# Patient Record
Sex: Male | Born: 1955 | Race: White | Hispanic: No | Marital: Single | Smoking: Never smoker
Health system: Southern US, Community
[De-identification: ages and names within clinical notes are randomized; demographics above are authoritative.]

## PROBLEM LIST (undated history)

## (undated) DIAGNOSIS — N400 Enlarged prostate without lower urinary tract symptoms: Secondary | ICD-10-CM

## (undated) DIAGNOSIS — I2699 Other pulmonary embolism without acute cor pulmonale: Secondary | ICD-10-CM

## (undated) HISTORY — PX: TONSILLECTOMY: SUR1361

## (undated) HISTORY — PX: VASECTOMY: SHX75

## (undated) HISTORY — PX: JOINT REPLACEMENT: SHX530

---

## 2017-11-24 ENCOUNTER — Other Ambulatory Visit: Payer: Self-pay

## 2017-11-24 ENCOUNTER — Encounter (HOSPITAL_COMMUNITY): Payer: Self-pay | Admitting: Emergency Medicine

## 2017-11-24 ENCOUNTER — Emergency Department (HOSPITAL_COMMUNITY)
Admission: EM | Admit: 2017-11-24 | Discharge: 2017-11-25 | Disposition: A | Discharge status: Home / Self Care | Payer: BC Managed Care – PPO | Attending: Emergency Medicine | Admitting: Emergency Medicine

## 2017-11-24 ENCOUNTER — Emergency Department (HOSPITAL_COMMUNITY): Payer: BC Managed Care – PPO

## 2017-11-24 DIAGNOSIS — R791 Abnormal coagulation profile: Secondary | ICD-10-CM | POA: Diagnosis not present

## 2017-11-24 DIAGNOSIS — M7981 Nontraumatic hematoma of soft tissue: Secondary | ICD-10-CM | POA: Diagnosis not present

## 2017-11-24 DIAGNOSIS — T148XXA Other injury of unspecified body region, initial encounter: Secondary | ICD-10-CM

## 2017-11-24 DIAGNOSIS — M545 Low back pain: Secondary | ICD-10-CM | POA: Diagnosis present

## 2017-11-24 DIAGNOSIS — Z7901 Long term (current) use of anticoagulants: Secondary | ICD-10-CM | POA: Diagnosis not present

## 2017-11-24 HISTORY — DX: Other pulmonary embolism without acute cor pulmonale: I26.99

## 2017-11-24 LAB — CBC WITH DIFFERENTIAL/PLATELET
Basophils Absolute: 0 10*3/uL (ref 0.0–0.1)
Basophils Relative: 0 %
EOS ABS: 0 10*3/uL (ref 0.0–0.7)
EOS PCT: 0 %
HCT: 35.9 % — ABNORMAL LOW (ref 39.0–52.0)
HEMOGLOBIN: 12.2 g/dL — AB (ref 13.0–17.0)
LYMPHS ABS: 0.7 10*3/uL (ref 0.7–4.0)
LYMPHS PCT: 8 %
MCH: 30.3 pg (ref 26.0–34.0)
MCHC: 34 g/dL (ref 30.0–36.0)
MCV: 89.1 fL (ref 78.0–100.0)
Monocytes Absolute: 0.7 10*3/uL (ref 0.1–1.0)
Monocytes Relative: 8 %
Neutro Abs: 7.1 10*3/uL (ref 1.7–7.7)
Neutrophils Relative %: 84 %
Platelets: 276 10*3/uL (ref 150–400)
RBC: 4.03 MIL/uL — AB (ref 4.22–5.81)
RDW: 12.9 % (ref 11.5–15.5)
WBC: 8.5 10*3/uL (ref 4.0–10.5)

## 2017-11-24 LAB — BASIC METABOLIC PANEL
Anion gap: 13 (ref 5–15)
BUN: 20 mg/dL (ref 8–23)
CHLORIDE: 103 mmol/L (ref 98–111)
CO2: 25 mmol/L (ref 22–32)
CREATININE: 0.93 mg/dL (ref 0.61–1.24)
Calcium: 9 mg/dL (ref 8.9–10.3)
GFR calc Af Amer: >60 mL/min (ref >60)
GFR calc non Af Amer: >60 mL/min (ref >60)
GLUCOSE: 127 mg/dL — AB (ref 70–99)
POTASSIUM: 3.9 mmol/L (ref 3.5–5.1)
Sodium: 141 mmol/L (ref 135–145)

## 2017-11-24 LAB — PROTIME-INR
INR: >10
Prothrombin Time: 89.9 seconds — ABNORMAL HIGH (ref 11.4–15.2)

## 2017-11-24 MED ORDER — ONDANSETRON 4 MG PO TBDP
4.0000 mg | ORAL_TABLET | Freq: Once | ORAL | Status: AC
  Administered 2017-11-24: 4 mg via ORAL
  Filled 2017-11-24: qty 1

## 2017-11-24 MED ORDER — KETOROLAC TROMETHAMINE 60 MG/2ML IM SOLN
60.0000 mg | Freq: Once | INTRAMUSCULAR | Status: AC
  Administered 2017-11-24: 60 mg via INTRAMUSCULAR
  Filled 2017-11-24: qty 2

## 2017-11-24 MED ORDER — CYCLOBENZAPRINE HCL 10 MG PO TABS: 5.0000 mg | ORAL_TABLET | Freq: Two times a day (BID) | ORAL | 0 refills | Status: DC | PRN

## 2017-11-24 MED ORDER — HYDROMORPHONE HCL 2 MG/ML IJ SOLN
2.0000 mg | Freq: Once | INTRAMUSCULAR | Status: AC
  Administered 2017-11-24: 2 mg via INTRAMUSCULAR
  Filled 2017-11-24: qty 1

## 2017-11-24 MED ORDER — HYDROMORPHONE HCL 2 MG/ML IJ SOLN: 2.0000 mg | Freq: Once | INTRAMUSCULAR | Status: DC

## 2017-11-24 MED ORDER — PHYTONADIONE 5 MG PO TABS
5.0000 mg | ORAL_TABLET | Freq: Once | ORAL | Status: AC
  Administered 2017-11-24: 5 mg via ORAL
  Filled 2017-11-24: qty 1

## 2017-11-24 MED ORDER — OXYCODONE-ACETAMINOPHEN 5-325 MG PO TABS: 1.0000 | ORAL_TABLET | Freq: Four times a day (QID) | ORAL | 0 refills | Status: DC | PRN

## 2017-11-24 NOTE — ED Triage Notes (Signed)
Per EMS-Pt called EMS with c/o low back and buttock pain x 1 week. Seen and tx one week ago by ortho. Pt has increased pain and decreased ability to walk due to pain, Took Tramadol and prednisone  Today. Pt is alert, oriented and appropriate. Denies loss of bowel or bladder function.

## 2017-11-24 NOTE — ED Notes (Signed)
Pt ambulatory in room with steady gait. Still c/o slight pain in left buttock but states it has "tremendously decreased since I got the shots."

## 2017-11-24 NOTE — ED Notes (Signed)
Patient transported to MRI 

## 2017-11-24 NOTE — ED Triage Notes (Signed)
Pt c/o left lateral buttock pain x1 week. Reports going bowling x2 weeks ago. Also recently picked up a 33 lb box without bending knees-bent at the waist. Saw PCP on Thursday. Was given Tramadol 50 mg-taken without pain relief. Went to orthopedist and was given Prednisone. Orthopedist retrieved referral from PCP yesterday morning for MRI. Awaiting insurance confirmation. 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 Pt lying on stomach in triage. Reports a "tremendous amount of pain and I'm not able to do anything on the left side. If I pull my buttock up on the left side when I'm standing or walking, it's some relief. It feels like an electric shock." No other c/c.

## 2017-11-24 NOTE — ED Provider Notes (Signed)
[facility]-EMERGENCY DEPT Provider Note   CSN: [id number] Arrival date & time: 11/24/17  1856     History   Chief Complaint Chief Complaint  Patient presents with  . Back Pain  . Tailbone Pain    HPI Tony Meyers is a 62 y.o. male on chronic Coumadin therapy for history of unprovoked pulmonary emboli who presents to the emergency department chief complaint of low back pain.  Patient states that he began having pain in his left low back and left buttock about 1 week ago.  He states that he woke up with the symptoms.  He was trying to think of something that may have provoked the pain and states that he did go bowling 2 weeks ago and the week prior to the onset of his pain he had lifted a 30 pound box.  Patient states that he did try to use good form when lifting the box.  He saw his primary care physician who started him on tramadol however he has had progressively worsening and severe pain.  He states that he was referred to an orthopedist who started him on prednisone and he is currently awaiting insurance approval of an MRI.  He states that his pain is severe.  It is worse when he tries to stand and he can only stand for about 5 minutes at a time before he has to crawl.  He is sleeping on a futon but states that he has to she meet to the end of the bed and then twist and put his hands on a wooden chair and then slowly lift himself up to standing.  He has been unable to perform ADLs and lives by himself.  He states that his pain is "tremendous" he is unable to lay on his left side.  He has been sleeping on his stomach and arrives on EMS gurney in the prone position.  He rates his pain at 6 out of 10 when lying down and states that it is aching but if he tries to move and he gets electrical shock pains in his buttock.  He denies fevers, chills or other signs or symptoms of systemic infection.  HPI  Past Medical History:  Diagnosis Date  . Pulmonary emboli (HCC)      There are no active problems to display for this patient.   History reviewed. No pertinent surgical history.      Home Medications    Prior to Admission medications   Not on File    Family History History reviewed. No pertinent family history.  Social History Social History   Tobacco Use  . Smoking status: Never Smoker  . Smokeless tobacco: Never Used  Substance Use Topics  . Alcohol use: Not Currently  . Drug use: Not Currently     Allergies   Patient has no known allergies.   Review of Systems Review of Systems Ten systems reviewed and are negative for acute change, except as noted in the HPI.    Physical Exam Updated Vital Signs BP (!) 159/100 (BP Location: Right Arm)   Pulse (!) 103   Temp 99.5 F (37.5 C) (Oral)   Resp 20   Ht 6\' 1"  (1.854 m)   Wt 93 kg   BMI 27.05 kg/m   Physical Exam Physical Exam  Nursing note and vitals reviewed. Constitutional: He appears well-developed and well-nourished. No distress.  HENT:  Head: Normocephalic and atraumatic.  Eyes: Conjunctivae normal are normal. No scleral icterus.  Neck: Normal  range of motion. Neck supple.  Cardiovascular: Normal rate, regular rhythm and normal heart sounds.   Pulmonary/Chest: Effort normal and breath sounds normal. No respiratory distress.  Abdominal: Soft. There is no tenderness.  Musculoskeletal: Patient examined in the prone position.  He has no midline lumbar tenderness.  Minimal tenderness in the left lumbar paraspinal muscles.  The left buttock is markedly edematous and firm with a deep violaceous hematoma on the lateral aspect.  His buttock is exquisitely tender to palpation. Neurological: He is alert.  Skin: Skin is warm and dry. He is not diaphoretic.  Psychiatric: His behavior is normal.     ED Treatments / Results  Labs (all labs ordered are listed, but only abnormal results are displayed) Labs Reviewed  PROTIME-INR    EKG None  Radiology Mr Lumbar Spine  Wo Contrast  Result Date: 11/24/2017 CLINICAL DATA:  Left buttock pain for 3 days, worsening. EXAM: MRI LUMBAR SPINE WITHOUT CONTRAST TECHNIQUE: Multiplanar, multisequence MR imaging of the lumbar spine was performed. No intravenous contrast was administered. COMPARISON:  None. FINDINGS: Segmentation:  Standard. Alignment: Maintained. There is some exaggeration of the normal lumbar lordosis. Vertebrae: No fracture or worrisome lesion. Degenerative endplate signal change is most notable at T12-L1 and L4-5. Conus medullaris and cauda equina: Conus extends to the L1-2 level. Conus and cauda equina appear normal. Paraspinal and other soft tissues: There is partial visualization of a collection in the left gluteal musculature measuring approximately 11.5 cm craniocaudal by 7.5 cm transverse by at least 5 cm AP. The lesion is scratch the collections scratch the collection has T1 hyperintensity within it with fluid-fluid levels and is most consistent with a hematoma. Disc levels: T12-L1: Negative. L1-2: Negative. L2-3: Minimal disc bulge without stenosis. L3-4: Shallow disc bulge with endplate spur. The central canal is open. Mild foraminal narrowing bilaterally is noted. L4-5: There is a shallow disc bulge without central canal stenosis. Mild left foraminal narrowing is noted. The right foramen is open. L5-S1: Shallow disc bulge without stenosis. IMPRESSION: Partial visualization of a large collection in the left gluteal musculature most consistent with a hematoma. Mild lumbar spondylosis without central canal narrowing. Mild bilateral foraminal narrowing is seen at L3-4 and there is mild left foraminal narrowing at L4-5. Electronically Signed   By: Drusilla Kannerhomas  Dalessio M.D.   On: 11/24/2017 21:39    Procedures Procedures (including critical care time)  Medications Ordered in ED Medications  HYDROmorphone (DILAUDID) injection 2 mg (2 mg Intramuscular Given 11/24/17 2010)  ondansetron (ZOFRAN-ODT) disintegrating  tablet 4 mg (4 mg Oral Given 11/24/17 2010)  ketorolac (TORADOL) injection 60 mg (60 mg Intramuscular Given 11/24/17 2010)     Initial Impression / Assessment and Plan / ED Course  I have reviewed the triage vital signs and the nursing notes.  Pertinent labs & imaging results that were available during my care of the patient were reviewed by me and considered in my medical decision making (see chart for details).     10:26 PM BP 116/77 (BP Location: Right Arm)   Pulse (!) 101   Temp 99.5 F (37.5 C) (Oral)   Resp 17   Ht 6\' 1"  (1.854 m)   Wt 93 kg   SpO2 100%   BMI 27.05 kg/m   Patient MRI shows no significant lumbar spinal pathology. He does have a very deep large hematoma.  Patient blood pressure has improved markedly with treatment of his pain.  I was able to reexamine the patient and had him stand  up.  He has an antalgic gait but states that he is feeling much better he is able to lift his leg some.  Upon examination I noticed that his right hand and wrist were covered in petechial hemorrhage.  I checked his mouth and throat and did not see any petechiae however I have consideration that the patient has a large spontaneous hematoma and potentially may have a supratherapeutic INR causing spontaneous bleed. Patient's labs are currently pending.  His pain is markedly improved.  I [facility].  Final Clinical Impressions(s) / ED Diagnoses   Final diagnoses:  None    ED Discharge Orders    None       Arthor CaptainHarris, Aldonia Keeven, PA-C 11/24/17 2243    [name], David, MD 11/25/17 1640

## 2017-11-25 NOTE — Discharge Instructions (Addendum)
Please have your coumadin level rechecked this week.  Please hold your coumadin level until it is rechecked this week.  Take pain medication as directed.  You may apply heat and ice.  Return for worsening symptoms.

## 2017-11-25 NOTE — ED Provider Notes (Signed)
INR is 11.46.  Discussed with Dr. Ranae PalmsYelverton, who recommends giving 5 mg of vitamin K.  Recommends holding his Coumadin until he can have his INR rechecked later this week.  Patient discharged home with Percocet and Flexeril per PA Harris.  Likely because the injury was that the patient was bowling, and may have strained his gluteus while doing this.  This also fits with the petechiae seen in his right hand from centrifugal force of throwing the bowling ball.   [name], [name], PA-C 11/25/17 0005    [name], David, MD 11/25/17 1640

## 2017-12-09 ENCOUNTER — Encounter (HOSPITAL_COMMUNITY): Payer: Self-pay | Admitting: Emergency Medicine

## 2017-12-09 ENCOUNTER — Emergency Department (HOSPITAL_COMMUNITY)
Admission: EM | Admit: 2017-12-09 | Discharge: 2017-12-09 | Disposition: A | Discharge status: Home / Self Care | Payer: BC Managed Care – PPO | Attending: Emergency Medicine | Admitting: Emergency Medicine

## 2017-12-09 ENCOUNTER — Other Ambulatory Visit: Payer: Self-pay

## 2017-12-09 DIAGNOSIS — Z79899 Other long term (current) drug therapy: Secondary | ICD-10-CM | POA: Diagnosis not present

## 2017-12-09 DIAGNOSIS — Z86718 Personal history of other venous thrombosis and embolism: Secondary | ICD-10-CM | POA: Insufficient documentation

## 2017-12-09 DIAGNOSIS — R339 Retention of urine, unspecified: Secondary | ICD-10-CM | POA: Insufficient documentation

## 2017-12-09 DIAGNOSIS — Z7901 Long term (current) use of anticoagulants: Secondary | ICD-10-CM | POA: Diagnosis not present

## 2017-12-09 DIAGNOSIS — R338 Other retention of urine: Secondary | ICD-10-CM

## 2017-12-09 LAB — URINALYSIS, ROUTINE W REFLEX MICROSCOPIC
Bilirubin Urine: NEGATIVE
Glucose, UA: NEGATIVE mg/dL
Hgb urine dipstick: NEGATIVE
KETONES UR: NEGATIVE mg/dL
Leukocytes, UA: NEGATIVE
NITRITE: NEGATIVE
PH: 6 (ref 5.0–8.0)
PROTEIN: NEGATIVE mg/dL
Specific Gravity, Urine: 1.003 — ABNORMAL LOW (ref 1.005–1.030)

## 2017-12-09 LAB — I-STAT CHEM 8, ED
BUN: 9 mg/dL (ref 8–23)
CALCIUM ION: 1.16 mmol/L (ref 1.15–1.40)
CHLORIDE: 103 mmol/L (ref 98–111)
Creatinine, Ser: 0.8 mg/dL (ref 0.61–1.24)
Glucose, Bld: 98 mg/dL (ref 70–99)
HEMATOCRIT: 35 % — AB (ref 39.0–52.0)
Hemoglobin: 11.9 g/dL — ABNORMAL LOW (ref 13.0–17.0)
POTASSIUM: 3.8 mmol/L (ref 3.5–5.1)
SODIUM: 140 mmol/L (ref 135–145)
TCO2: 25 mmol/L (ref 22–32)

## 2017-12-09 LAB — PROTIME-INR
INR: 1.91
PROTHROMBIN TIME: 21.8 s — AB (ref 11.4–15.2)

## 2017-12-09 NOTE — ED Provider Notes (Signed)
[facility] Mey[facility] Provider Note   CSN: [id number] Arrival date & time: 12/09/17  0223     History   Chief Complaint Chief Complaint  Patient presents with  . Urinary Retention    HPI Tony Meyers is a 62 y.o. male.  HPI  62 year old male with history of pulmonary embolism on warfarin comes in with chief complaint of abdominal discomfort. Patient reports that over the past few days has been having urinary urgency, and often there is no urine that comes out when he tries to go to the bathroom.  Patient is also noted suprapubic abdominal pain, and pain in his back.   Patient has known history of BPH and is supposed to follow-up with urology in 2 weeks.  Review of system is negative for nausea, vomiting, fevers, chills, pain with urination or blood in the urine.  Past Medical History:  Diagnosis Date  . Pulmonary emboli (HCC)     There are no active problems to display for this patient.   History reviewed. No pertinent surgical history.      Home Medications    Prior to Admission medications   Medication Sig Start Date End Date Taking? Authorizing Provider  acetaminophen (TYLENOL) 500 MG tablet Take 500 mg by mouth every 6 (six) hours as needed for mild pain.   Yes [provider]  terazosin (HYTRIN) 5 MG capsule Take 5 mg by mouth daily. 11/02/17  Yes [provider]  warfarin (COUMADIN) 3 MG tablet Take 1.5-3 mg by mouth daily. 3 MG on Sunday, Monday, Tuesday, Wednesday, Thursday, Friday and 4.5 MG on Saturday   Yes [provider]  cyclobenzaprine (FLEXERIL) 10 MG tablet Take 0.5-1 tablets (5-10 mg total) by mouth 2 (two) times daily as needed for muscle spasms. Patient not taking: Reported on 12/09/2017 11/24/17   Arthor Captain, PA-C  oxyCODONE-acetaminophen (PERCOCET) 5-325 MG tablet Take 1-2 tablets by mouth every 6 (six) hours as needed. Patient not taking: Reported on 12/09/2017 11/24/17   Arthor Captain, PA-C     Family History History reviewed. No pertinent family history.  Social History Social History   Tobacco Use  . Smoking status: Never Smoker  . Smokeless tobacco: Never Used  Substance Use Topics  . Alcohol use: Not Currently  . Drug use: Not Currently     Allergies   Patient has no known allergies.   Review of Systems Review of Systems  Constitutional: Positive for activity change.  Gastrointestinal: Positive for abdominal pain. Negative for nausea and vomiting.  Genitourinary: Positive for difficulty urinating and urgency. Negative for dysuria.  Musculoskeletal: Positive for back pain.     Physical Exam Updated Vital Signs BP 132/89 (BP Location: Right Arm)   Pulse 74   Temp 98 F (36.7 C) (Oral)   Resp 18   Ht 6\' 1"  (1.854 m)   Wt 95.3 kg   SpO2 99%   BMI 27.71 kg/m   Physical Exam  Constitutional: He is oriented to person, place, and time. He appears well-developed.  HENT:  Head: Atraumatic.  Neck: Neck supple.  Cardiovascular: Normal rate.  Pulmonary/Chest: Effort normal.  Abdominal: Soft. There is tenderness.  Suprapubic tenderness. No flank tenderness appreciated  Neurological: He is alert and oriented to person, place, and time.  Skin: Skin is warm.  Nursing note and vitals reviewed.    ED Treatments / Results  Labs (all labs ordered are listed, but only abnormal results are displayed) Labs Reviewed  URINALYSIS, ROUTINE W REFLEX MICROSCOPIC -  Abnormal; Notable for the following components:      Result Value   Color, Urine STRAW (*)    Specific Gravity, Urine 1.003 (*)    All other components within normal limits  URINE CULTURE  PROTIME-INR  I-STAT CHEM 8, ED    EKG None  Radiology No results found.  Procedures Procedures (including critical care time)  Medications Ordered in ED Medications - No data to display   Initial Impression / Assessment and Plan / ED Course  I have reviewed the triage vital signs and the nursing  notes.  Pertinent labs & imaging results that were available during my care of the patient were reviewed by me and considered in my medical decision making (see chart for details).     62 year old male comes in with chief complaint of abdominal discomfort with urinary urgency. Differential diagnosis includes pyelonephritis, cystitis, inguinal hernia, prostatitis, urinary retention.  Post void bladder scan ordered and it reveals more than 500 cc of urine. Foley catheter was inserted and patient had immediate 750 cc of urine output. UA is clean, and clinical suspicion for UTI is extremely low.  Urine cultures have been sent by the nursing staff, however if there is nonspecific growth then we will not pursue treatment.  Patient is on Coumadin in the past he has had significantly elevated INR.  In the setting of possibility for obstructive uropathy, we will get INR and chemistry checked prior to discharging patient.  Patient will be discharged with a Foley catheter.  EMT staff has been requested to provide education on the Foley catheter management at home.  Final Clinical Impressions(s) / ED Diagnoses   Final diagnoses:  Acute urinary retention    ED Discharge Orders    None       Derwood Kaplan, MD 12/09/17 [phone]

## 2017-12-09 NOTE — Discharge Instructions (Addendum)
We signed the ER for abdominal discomfort.  The cause for your abdominal discomfort was urinary retention. We placed a Foley catheter and had close to 800 mL of urine output.   Please call [facility] at the number provided for a close follow-up appointment.

## 2017-12-09 NOTE — ED Triage Notes (Signed)
Patient complaining of having trouble urinating. Patient states it feels like he got to go but cant.

## 2017-12-10 LAB — URINE CULTURE
Culture: NO GROWTH
Special Requests: NORMAL

## 2017-12-31 ENCOUNTER — Encounter: Payer: Self-pay | Admitting: Hematology and Oncology

## 2017-12-31 ENCOUNTER — Telehealth: Payer: Self-pay | Admitting: Hematology and Oncology

## 2017-12-31 NOTE — Telephone Encounter (Signed)
New referral received from Dr. Modesto Charon for hx of pe and dvt. Needs eval for lifelong anticoagulation. Pt cld and scheduled an appt to see [name] on 10/23 at 345pm. Letter mailed.

## 2018-01-07 ENCOUNTER — Other Ambulatory Visit: Payer: Self-pay | Admitting: Urology

## 2018-01-07 DIAGNOSIS — R972 Elevated prostate specific antigen [PSA]: Secondary | ICD-10-CM

## 2018-01-11 ENCOUNTER — Telehealth: Payer: Self-pay | Admitting: Hematology and Oncology

## 2018-01-11 NOTE — Telephone Encounter (Signed)
Pt cld to reschedule appt to see [name] on 04/13/18 at 1pm.

## 2018-01-11 NOTE — Telephone Encounter (Signed)
Pt cld to reschedule appt to 04/13/18 to see [name] at 1pm.

## 2018-01-19 NOTE — Patient Instructions (Signed)
Tony Meyers  01/19/2018   Your procedure is scheduled on: 01-28-18   Report to [facility] Main  Entrance    Report to admitting at 9:30AM    Call this number if you have problems the morning of surgery [phone]     Remember: Do not eat food or drink liquids :After Midnight. BRUSH YOUR TEETH MORNING OF SURGERY AND RINSE YOUR MOUTH OUT, NO CHEWING GUM CANDY OR MINTS.     Take these medicines the morning of surgery with A SIP OF WATER: none                                You may not have any metal on your body including hair pins and              piercings  Do not wear jewelry, make-up, lotions, powders or perfumes, deodorant                   Men may shave face and neck.   Do not bring valuables to the hospital. [place] IS NOT             RESPONSIBLE   FOR VALUABLES.  Contacts, dentures or bridgework may not be worn into surgery.  Leave suitcase in the car. After surgery it may be brought to your room.                 Please read over the following fact sheets you were given: _____________________________________________________________________             [facility] - Preparing for Surgery Before surgery, you can play an important role.  Because skin is not sterile, your skin needs to be as free of germs as possible.  You can reduce the number of germs on your skin by washing with CHG (chlorahexidine gluconate) soap before surgery.  CHG is an antiseptic cleaner which kills germs and bonds with the skin to continue killing germs even after washing. Please DO NOT use if you have an allergy to CHG or antibacterial soaps.  If your skin becomes reddened/irritated stop using the CHG and inform your nurse when you arrive at Short Stay. Do not shave (including legs and underarms) for at least 48 hours prior to the first CHG shower.  You may shave your face/neck. Please follow these instructions carefully:  1.  Shower with CHG Soap the night before  surgery and the  morning of Surgery.  2.  If you choose to wash your hair, wash your hair first as usual with your  normal  shampoo.  3.  After you shampoo, rinse your hair and body thoroughly to remove the  shampoo.                           4.  Use CHG as you would any other liquid soap.  You can apply chg directly  to the skin and wash                       Gently with a scrungie or clean washcloth.  5.  Apply the CHG Soap to your body ONLY FROM THE NECK DOWN.   Do not use on face/ open  Wound or open sores. Avoid contact with eyes, ears mouth and genitals (private parts).                       Wash face,  Genitals (private parts) with your normal soap.             6.  Wash thoroughly, paying special attention to the area where your surgery  will be performed.  7.  Thoroughly rinse your body with warm water from the neck down.  8.  DO NOT shower/wash with your normal soap after using and rinsing off  the CHG Soap.                9.  Pat yourself dry with a clean towel.            10.  Wear clean pajamas.            11.  Place clean sheets on your bed the night of your first shower and do not  sleep with pets. Day of Surgery : Do not apply any lotions/deodorants the morning of surgery.  Please wear clean clothes to the hospital/surgery center.  FAILURE TO FOLLOW THESE INSTRUCTIONS MAY RESULT IN THE CANCELLATION OF YOUR SURGERY PATIENT SIGNATURE_________________________________  NURSE SIGNATURE__________________________________  ________________________________________________________________________

## 2018-01-22 ENCOUNTER — Encounter (HOSPITAL_COMMUNITY): Payer: Self-pay

## 2018-01-22 ENCOUNTER — Encounter (HOSPITAL_COMMUNITY)
Admission: RE | Admit: 2018-01-22 | Discharge: 2018-01-22 | Disposition: A | Discharge status: Home / Self Care | Payer: BC Managed Care – PPO | Source: Ambulatory Visit | Attending: Urology | Admitting: Urology

## 2018-01-22 ENCOUNTER — Other Ambulatory Visit: Payer: Self-pay

## 2018-01-22 DIAGNOSIS — N401 Enlarged prostate with lower urinary tract symptoms: Secondary | ICD-10-CM | POA: Diagnosis not present

## 2018-01-22 DIAGNOSIS — Z01812 Encounter for preprocedural laboratory examination: Secondary | ICD-10-CM | POA: Diagnosis present

## 2018-01-22 DIAGNOSIS — N138 Other obstructive and reflux uropathy: Secondary | ICD-10-CM | POA: Diagnosis not present

## 2018-01-22 HISTORY — DX: Benign prostatic hyperplasia without lower urinary tract symptoms: N40.0

## 2018-01-22 LAB — CBC
HCT: 42.6 % (ref 39.0–52.0)
Hemoglobin: 14.1 g/dL (ref 13.0–17.0)
MCH: 30.5 pg (ref 26.0–34.0)
MCHC: 33.1 g/dL (ref 30.0–36.0)
MCV: 92 fL (ref 80.0–100.0)
PLATELETS: 214 10*3/uL (ref 150–400)
RBC: 4.63 MIL/uL (ref 4.22–5.81)
RDW: 12.1 % (ref 11.5–15.5)
WBC: 5.7 10*3/uL (ref 4.0–10.5)
nRBC: 0 % (ref 0.0–0.2)

## 2018-01-27 ENCOUNTER — Encounter: Payer: BC Managed Care – PPO | Admitting: Hematology and Oncology

## 2018-01-27 NOTE — Progress Notes (Signed)
RN spoke with patient ; informed that surgery time has changed and that he is to report to admitting at 0830 on 01-28-18. Patient verbalized understanding .

## 2018-01-27 NOTE — H&P (Signed)
CC/HPI: I have symptoms of an enlarged prostate.     Shaden returns today in f/u for his history of BPH with BOO and rentention and an elevated PSA in late August. His PSA was 13.1. He has had the foley out for about 2 weeks and is voiding with a reduced but reasonable stream. He has reduced frequency and nocturia 1-2x but has cut back his fluids. He has no hematuria or dysuria. He has no urgency. He feels like he is emptying. He has no associated signs or symptoms.     ALLERGIES: None   MEDICATIONS: Terazosin Hcl 10 mg capsule 1 capsule PO Daily  Warfarin Sodium 3 mg tablet     GU PSH: Complex Uroflow - 12/14/2017 Cystoscopy - 12/14/2017 Prostate Needle Biopsy - 2017 Vasectomy    NON-GU PSH: None   GU PMH: Urinary Retention - 12/16/2017, He has long standing BPH with BOO with recent retention that occurred after a fall with a left gluteal hematoma, but it is difficult to say if the two events were related. I am going to have him return on Monday for a cystoscopy and a voiding trial. , - 12/09/2017 BPH w/LUTS - 12/09/2017 Elevated PSA, His PSA was 13 last week and that could have been related to impending retention, but this will need to be followed closely with possible prostate MRI and biopsy if it remain elevated. he did have a negative biopsy in 2017 and I will try to get those records from his prior Urologist in [place]. - 12/09/2017    NON-GU PMH: Depression DVT, History Pulmonary Embolism, History    FAMILY HISTORY: copd - Mother   SOCIAL HISTORY: Marital Status: Single Preferred Language: English; Race: White Current Smoking Status: Patient has never smoked.   Tobacco Use Assessment Completed: Used Tobacco in last 30 days? Drinks 3 caffeinated drinks per day. 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 Patient denies hard to postpone urination, burning/  pain with urination, leakage of urine, stream starts and stops, trouble starting your stream, have to strain to urinate , erection problems, and penile pain.  Gastrointestinal (Upper):   Patient denies nausea, vomiting, and indigestion/ heartburn.  Gastrointestinal (Lower):   Patient denies diarrhea and constipation.  Constitutional:   Patient denies fever, night sweats, weight loss, and fatigue.  Skin:   Patient denies skin rash/ lesion and itching.  Eyes:   Patient denies blurred vision and double vision.  Ears/ Nose/ Throat:   Patient denies sore throat and sinus problems.  Hematologic/Lymphatic:   Patient denies swollen glands and easy bruising.  Cardiovascular:   Patient denies leg swelling and chest pains.  Respiratory:   Patient denies cough and shortness of breath.  Endocrine:   Patient denies excessive thirst.  Musculoskeletal:   Patient denies back pain and joint pain.  Neurological:   Patient denies dizziness and headaches.  Psychologic:   Patient denies depression and anxiety.   VITAL SIGNS:      01/04/2018 09:57 AM  BP 129/74 mmHg  Pulse 74 /min  Temperature 98.0 F / 36.6 C   MULTI-SYSTEM PHYSICAL EXAMINATION:    Constitutional: Well-nourished. No physical deformities. Normally developed. Good grooming.  Respiratory: No labored breathing, no use of accessory muscles. CTA  Cardiovascular: Normal temperature, RRR without murmur, no swelling, no varicosities.      PAST DATA REVIEWED:  Source  Of History:  Patient   12/04/17 03/29/15 01/30/15 03/06/08 02/11/05 03/20/04  PSA  Total PSA 13.1 ng/dl 1.61 ng/dl 0.96 ng/dl 0.45 ng/dl 1.2 ng/dl 4.09 ng/dl    PROCEDURES:          Urinalysis Dipstick Dipstick Cont'd  Color: Yellow Bilirubin: Neg mg/dL  Appearance: Clear Ketones: Neg mg/dL  Specific Gravity: 8.119 Blood: Neg ery/uL  pH: <=5.0 Protein: Neg mg/dL  Glucose: Neg mg/dL Urobilinogen: 0.2 mg/dL    Nitrites: Neg    Leukocyte Esterase: Neg leu/uL    ASSESSMENT:       ICD-10 Details  1 GU:   BPH w/LUTS - N40.1 He is voiding but has a reduced stream and he would like to proceed with intervention. He has a middle lobe and I think TURP would be very appropriate but I discussed the addition of finasteride, Laser and Urolift. I will get him scheduled in the near future. I reviewd the risks of a TURP including bleeding, infection, incontinence, stricture, need for secondary procedures, ejaculatory and erectile dysfunction, thrombotic events, fluid overload and anesthetic complications. I explained that 95% of men will have relief of the obstructive symptoms and about 70% will have relief of the irritative symptoms.   2   Weak Urinary Stream - R39.12   3   Elevated PSA - R97.20 I will repeat a PSA today and if it remains elevated, I will biopsy him at the time of the TURP.    PLAN:           Orders Labs PSA with Reflex          Schedule Return Visit/Planned Activity: Next Available Appointment - Schedule Surgery             Note: TURP  Procedure: Unspecified Date - Cystoscopy TURP - 14782 Notes: Next available.           Document Letter(s):  Created for Patient: Clinical Summary         Notes:   CC: Dr. Sigmund Hazel.         Next Appointment:      Next Appointment: 01/22/2018 03:45 PM    Appointment Type: Laboratory Appointment    Location: [facility] [phone]    Provider: Lab LAB    Reason for Visit: psa with reflex-Paislynn Hegstrom

## 2018-01-28 ENCOUNTER — Other Ambulatory Visit: Payer: Self-pay

## 2018-01-28 ENCOUNTER — Encounter (HOSPITAL_COMMUNITY)
Admission: RE | Disposition: A | Discharge status: Home / Self Care | Payer: Self-pay | Source: Ambulatory Visit | Attending: Urology

## 2018-01-28 ENCOUNTER — Ambulatory Visit (HOSPITAL_COMMUNITY): Payer: BC Managed Care – PPO

## 2018-01-28 ENCOUNTER — Ambulatory Visit (HOSPITAL_COMMUNITY): Payer: BC Managed Care – PPO | Admitting: Anesthesiology

## 2018-01-28 ENCOUNTER — Observation Stay (HOSPITAL_COMMUNITY)
Admission: RE | Admit: 2018-01-28 | Discharge: 2018-01-29 | Disposition: A | Discharge status: Home / Self Care | Payer: BC Managed Care – PPO | Source: Ambulatory Visit | Attending: Urology | Admitting: Urology

## 2018-01-28 ENCOUNTER — Encounter (HOSPITAL_COMMUNITY): Payer: Self-pay

## 2018-01-28 DIAGNOSIS — N320 Bladder-neck obstruction: Secondary | ICD-10-CM | POA: Insufficient documentation

## 2018-01-28 DIAGNOSIS — R339 Retention of urine, unspecified: Secondary | ICD-10-CM | POA: Insufficient documentation

## 2018-01-28 DIAGNOSIS — Z7901 Long term (current) use of anticoagulants: Secondary | ICD-10-CM | POA: Diagnosis not present

## 2018-01-28 DIAGNOSIS — Z95810 Presence of automatic (implantable) cardiac defibrillator: Secondary | ICD-10-CM | POA: Diagnosis not present

## 2018-01-28 DIAGNOSIS — Z86711 Personal history of pulmonary embolism: Secondary | ICD-10-CM | POA: Insufficient documentation

## 2018-01-28 DIAGNOSIS — F329 Major depressive disorder, single episode, unspecified: Secondary | ICD-10-CM | POA: Diagnosis not present

## 2018-01-28 DIAGNOSIS — N401 Enlarged prostate with lower urinary tract symptoms: Principal | ICD-10-CM | POA: Insufficient documentation

## 2018-01-28 DIAGNOSIS — R972 Elevated prostate specific antigen [PSA]: Secondary | ICD-10-CM

## 2018-01-28 DIAGNOSIS — N138 Other obstructive and reflux uropathy: Secondary | ICD-10-CM | POA: Diagnosis present

## 2018-01-28 DIAGNOSIS — Z79899 Other long term (current) drug therapy: Secondary | ICD-10-CM | POA: Insufficient documentation

## 2018-01-28 HISTORY — PX: TRANSURETHRAL RESECTION OF PROSTATE: SHX73

## 2018-01-28 LAB — PROTIME-INR
INR: 0.98
Prothrombin Time: 12.9 seconds (ref 11.4–15.2)

## 2018-01-28 SURGERY — TURP (TRANSURETHRAL RESECTION OF PROSTATE)
Anesthesia: General | Site: Prostate

## 2018-01-28 MED ORDER — MIDAZOLAM HCL 2 MG/2ML IJ SOLN
INTRAMUSCULAR | Status: AC
  Filled 2018-01-28: qty 2

## 2018-01-28 MED ORDER — CEFAZOLIN SODIUM-DEXTROSE 2-4 GM/100ML-% IV SOLN
INTRAVENOUS | Status: AC
  Filled 2018-01-28: qty 100

## 2018-01-28 MED ORDER — FENTANYL CITRATE (PF) 100 MCG/2ML IJ SOLN
INTRAMUSCULAR | Status: AC
  Filled 2018-01-28: qty 2

## 2018-01-28 MED ORDER — ONDANSETRON HCL 4 MG/2ML IJ SOLN
INTRAMUSCULAR | Status: DC | PRN
  Administered 2018-01-28: 4 mg via INTRAVENOUS

## 2018-01-28 MED ORDER — SENNOSIDES-DOCUSATE SODIUM 8.6-50 MG PO TABS: 1.0000 | ORAL_TABLET | Freq: Every evening | ORAL | Status: DC | PRN

## 2018-01-28 MED ORDER — DIPHENHYDRAMINE HCL 50 MG/ML IJ SOLN: 12.5000 mg | Freq: Four times a day (QID) | INTRAMUSCULAR | Status: DC | PRN

## 2018-01-28 MED ORDER — HYDROCODONE-ACETAMINOPHEN 7.5-325 MG PO TABS: 1.0000 | ORAL_TABLET | Freq: Once | ORAL | Status: DC | PRN

## 2018-01-28 MED ORDER — HYDROMORPHONE HCL 1 MG/ML IJ SOLN: 0.5000 mg | INTRAMUSCULAR | Status: DC | PRN

## 2018-01-28 MED ORDER — LACTATED RINGERS IV SOLN
INTRAVENOUS | Status: DC
  Administered 2018-01-28 (×2): via INTRAVENOUS

## 2018-01-28 MED ORDER — MIDAZOLAM HCL 5 MG/5ML IJ SOLN
INTRAMUSCULAR | Status: DC | PRN
  Administered 2018-01-28: 2 mg via INTRAVENOUS

## 2018-01-28 MED ORDER — ONDANSETRON HCL 4 MG/2ML IJ SOLN
INTRAMUSCULAR | Status: AC
  Filled 2018-01-28: qty 2

## 2018-01-28 MED ORDER — CEPHALEXIN 500 MG PO CAPS
500.0000 mg | ORAL_CAPSULE | Freq: Three times a day (TID) | ORAL | Status: DC
  Administered 2018-01-28 (×2): 500 mg via ORAL
  Filled 2018-01-28 (×3): qty 1

## 2018-01-28 MED ORDER — DEXAMETHASONE SODIUM PHOSPHATE 10 MG/ML IJ SOLN
INTRAMUSCULAR | Status: AC
  Filled 2018-01-28: qty 1

## 2018-01-28 MED ORDER — BISACODYL 10 MG RE SUPP: 10.0000 mg | Freq: Every day | RECTAL | Status: DC | PRN

## 2018-01-28 MED ORDER — HYDROMORPHONE HCL 1 MG/ML IJ SOLN: 0.2500 mg | INTRAMUSCULAR | Status: DC | PRN

## 2018-01-28 MED ORDER — CEFAZOLIN SODIUM-DEXTROSE 2-4 GM/100ML-% IV SOLN
2.0000 g | INTRAVENOUS | Status: AC
  Administered 2018-01-28: 2 g via INTRAVENOUS

## 2018-01-28 MED ORDER — FLEET ENEMA 7-19 GM/118ML RE ENEM: 1.0000 | ENEMA | Freq: Once | RECTAL | Status: DC | PRN

## 2018-01-28 MED ORDER — SODIUM CHLORIDE 0.9 % IR SOLN
Status: DC | PRN
  Administered 2018-01-28 (×4): 6000 mL

## 2018-01-28 MED ORDER — LIDOCAINE 2% (20 MG/ML) 5 ML SYRINGE
INTRAMUSCULAR | Status: DC | PRN
  Administered 2018-01-28: 100 mg via INTRAVENOUS

## 2018-01-28 MED ORDER — DIPHENHYDRAMINE HCL 12.5 MG/5ML PO ELIX: 12.5000 mg | ORAL_SOLUTION | Freq: Four times a day (QID) | ORAL | Status: DC | PRN

## 2018-01-28 MED ORDER — 0.9 % SODIUM CHLORIDE (POUR BTL) OPTIME
TOPICAL | Status: DC | PRN
  Administered 2018-01-28: 1000 mL

## 2018-01-28 MED ORDER — PROMETHAZINE HCL 25 MG/ML IJ SOLN: 6.2500 mg | INTRAMUSCULAR | Status: DC | PRN

## 2018-01-28 MED ORDER — DEXAMETHASONE SODIUM PHOSPHATE 10 MG/ML IJ SOLN
INTRAMUSCULAR | Status: DC | PRN
  Administered 2018-01-28: 10 mg via INTRAVENOUS

## 2018-01-28 MED ORDER — HYOSCYAMINE SULFATE 0.125 MG SL SUBL
0.1250 mg | SUBLINGUAL_TABLET | SUBLINGUAL | Status: DC | PRN
  Filled 2018-01-28: qty 1

## 2018-01-28 MED ORDER — PROPOFOL 10 MG/ML IV BOLUS
INTRAVENOUS | Status: DC | PRN
  Administered 2018-01-28: 200 mg via INTRAVENOUS

## 2018-01-28 MED ORDER — OXYCODONE HCL 5 MG PO TABS
5.0000 mg | ORAL_TABLET | ORAL | Status: DC | PRN
  Administered 2018-01-28: 5 mg via ORAL
  Filled 2018-01-28: qty 1

## 2018-01-28 MED ORDER — ONDANSETRON HCL 4 MG/2ML IJ SOLN: 4.0000 mg | INTRAMUSCULAR | Status: DC | PRN

## 2018-01-28 MED ORDER — MEPERIDINE HCL 50 MG/ML IJ SOLN: 6.2500 mg | INTRAMUSCULAR | Status: DC | PRN

## 2018-01-28 MED ORDER — FENTANYL CITRATE (PF) 100 MCG/2ML IJ SOLN
INTRAMUSCULAR | Status: DC | PRN
  Administered 2018-01-28 (×3): 50 ug via INTRAVENOUS

## 2018-01-28 MED ORDER — PROPOFOL 10 MG/ML IV BOLUS
INTRAVENOUS | Status: AC
  Filled 2018-01-28: qty 20

## 2018-01-28 MED ORDER — KCL IN DEXTROSE-NACL 20-5-0.45 MEQ/L-%-% IV SOLN
INTRAVENOUS | Status: DC
  Administered 2018-01-28 (×2): via INTRAVENOUS
  Filled 2018-01-28 (×2): qty 1000

## 2018-01-28 MED ORDER — ZOLPIDEM TARTRATE 5 MG PO TABS: 5.0000 mg | ORAL_TABLET | Freq: Every evening | ORAL | Status: DC | PRN

## 2018-01-28 MED ORDER — ACETAMINOPHEN 325 MG PO TABS
650.0000 mg | ORAL_TABLET | ORAL | Status: DC | PRN
  Administered 2018-01-28: 650 mg via ORAL
  Filled 2018-01-28: qty 2

## 2018-01-28 MED ORDER — SODIUM CHLORIDE 0.9 % IR SOLN
3000.0000 mL | Status: DC
  Administered 2018-01-28 – 2018-01-29 (×3): 3000 mL

## 2018-01-28 SURGICAL SUPPLY — 27 items
BAG URINE DRAINAGE (UROLOGICAL SUPPLIES) ×3 IMPLANT
BAG URO CATCHER STRL LF (MISCELLANEOUS) ×3 IMPLANT
CATH FOLEY 3WAY 30CC 22FR (CATHETERS) IMPLANT
COVER WAND RF STERILE (DRAPES) IMPLANT
DRSG TELFA 3X8 NADH (GAUZE/BANDAGES/DRESSINGS) IMPLANT
ELECT REM PT RETURN 15FT ADLT (MISCELLANEOUS) ×3 IMPLANT
GLOVE SURG SS PI 8.0 STRL IVOR (GLOVE) IMPLANT
GOWN STRL REUS W/TWL XL LVL3 (GOWN DISPOSABLE) ×3 IMPLANT
HOLDER FOLEY CATH W/STRAP (MISCELLANEOUS) ×3 IMPLANT
INST BIOPSY MAXCORE 18GX25 (NEEDLE) IMPLANT
INSTR BIOPSY MAXCORE 18GX20 (NEEDLE) IMPLANT
KIT RM TURNOVER CYSTO AR (KITS) ×3 IMPLANT
LOOP CUT BIPOLAR 24F LRG (ELECTROSURGICAL) ×3 IMPLANT
MANIFOLD NEPTUNE II (INSTRUMENTS) ×3 IMPLANT
NDL SAFETY ECLIPSE 18X1.5 (NEEDLE) IMPLANT
NEEDLE HYPO 18GX1.5 SHARP (NEEDLE)
NEEDLE SPNL 22GX7 QUINCKE BK (NEEDLE) IMPLANT
PACK CYSTO (CUSTOM PROCEDURE TRAY) ×3 IMPLANT
SET ASPIRATION TUBING (TUBING) IMPLANT
SURGILUBE 2OZ TUBE FLIPTOP (MISCELLANEOUS) ×3 IMPLANT
SYR 30ML LL (SYRINGE) IMPLANT
SYR CONTROL 10ML LL (SYRINGE) IMPLANT
SYRINGE IRR TOOMEY STRL 70CC (SYRINGE) ×3 IMPLANT
TOWEL OR 17X26 10 PK STRL BLUE (TOWEL DISPOSABLE) ×3 IMPLANT
TUBING CONNECTING 10 (TUBING) ×3 IMPLANT
TUBING UROLOGY SET (TUBING) ×3 IMPLANT
UNDERPAD 30X30 INCONTINENT (UNDERPADS AND DIAPERS) ×3 IMPLANT

## 2018-01-28 NOTE — Anesthesia Procedure Notes (Signed)
Procedure Name: LMA Insertion Date/Time: 01/28/2018 10:49 AM Performed by: Doran Clay, CRNA Pre-anesthesia Checklist: Patient identified, Emergency Drugs available, Suction available, Patient being monitored and Timeout performed Patient Re-evaluated:Patient Re-evaluated prior to induction Oxygen Delivery Method: Circle system utilized Preoxygenation: Pre-oxygenation with 100% oxygen Induction Type: IV induction LMA: LMA inserted LMA Size: 5.0 Tube type: Oral Number of attempts: 1 Placement Confirmation: positive ETCO2 and breath sounds checked- equal and bilateral Tube secured with: Tape Dental Injury: Teeth and Oropharynx as per pre-operative assessment

## 2018-01-28 NOTE — Anesthesia Preprocedure Evaluation (Signed)
Anesthesia Evaluation  Patient identified by MRN, date of birth, ID band Patient awake    Reviewed: Allergy & Precautions, NPO status , Patient's Chart, lab work & pertinent test results  Airway Mallampati: II  TM Distance: >3 FB Neck ROM: Full    Dental no notable dental hx. 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   Neuro/Psych negative neurological ROS  negative psych ROS   GI/Hepatic negative GI ROS, Neg liver ROS,   Endo/Other  negative endocrine ROS  Renal/GU negative Renal ROS   BPH with bladder outlet obstruction    Musculoskeletal negative musculoskeletal ROS (+)   Abdominal   Peds  Hematology Coumadin- last dose last week   Anesthesia Other Findings   Reproductive/Obstetrics                             Anesthesia Physical Anesthesia Plan  ASA: II  Anesthesia Plan: General   Post-op Pain Management:    Induction:   PONV Risk Score and Plan: 4 or greater and Ondansetron, Dexamethasone, Treatment may vary due to age or medical condition and Midazolam  Airway Management Planned: LMA  Additional Equipment:   Intra-op Plan:   Post-operative Plan: Extubation in OR  Informed Consent: I have reviewed the patients History and Physical, chart, labs and discussed the procedure including the risks, benefits and alternatives for the proposed anesthesia with the patient or authorized representative who has indicated his/her understanding and acceptance.   Dental advisory given  Plan Discussed with: CRNA and Surgeon  Anesthesia Plan Comments:         Anesthesia Quick Evaluation

## 2018-01-28 NOTE — Transfer of Care (Signed)
Immediate Anesthesia Transfer of Care Note  Patient: Tony Meyers  Procedure(s) Performed: TRANSURETHRAL RESECTION OF THE PROSTATE (TURP) (N/A Prostate)  Patient Location: PACU  Anesthesia Type:General  Level of Consciousness: awake, alert  and oriented  Airway & Oxygen Therapy: Patient Spontanous Breathing and Patient connected to face mask oxygen  Post-op Assessment: Report given to RN and Post -op Vital signs reviewed and stable  Post vital signs: Reviewed and stable  Last Vitals:  Vitals Value Taken Time  BP 143/86 01/28/2018 12:01 PM  Temp    Pulse 63 01/28/2018 12:03 PM  Resp 14 01/28/2018 12:03 PM  SpO2 100 % 01/28/2018 12:03 PM  Vitals shown include unvalidated device data.  Last Pain:  Vitals:   01/28/18 0844  TempSrc:   PainSc: 0-No pain         Complications: No apparent anesthesia complications

## 2018-01-28 NOTE — Discharge Instructions (Signed)
Transurethral Resection of the Prostate, Care After °Refer to this sheet in the next few weeks. These instructions provide you with information about caring for yourself after your procedure. Your health care provider may also give you more specific instructions. Your treatment has been planned according to current medical practices, but problems sometimes occur. Call your health care provider if you have any problems or questions after your procedure. °What can I expect after the procedure? °After the procedure, it is common to have: °· Mild pain in your lower abdomen. °· Soreness or mild discomfort in your penis from having the catheter inserted during the procedure. °· A feeling of urgency when you need to urinate. °· A small amount of blood in your urine. You may notice some small blood clots in your urine. These are normal. ° °Follow these instructions at home: °Medicines ° °· Take over-the-counter and prescription medicines only as told by your health care provider. °· Do not drive or operate heavy machinery while taking prescription pain medicine. °· Do not drive for 24 hours if you received a sedative. °· If you were prescribed antibiotic medicine, take it as told by your health care provider. Do not stop taking the antibiotic even if you start to feel better. °Activity °· Return to your normal activities as told by your health care provider. Ask your health care provider what activities are safe for you. °· Do not lift anything that is heavier than 10 lb (4.5 kg) for 3 weeks after your procedure, or as long as told by your health care provider. °· Avoid intense physical activity for as long as told by your health care provider. °· Walk at least one time every day. This helps to prevent blood clots. You may increase your physical activity gradually as you start to feel better. °Lifestyle °· Do not drink alcohol for as long as told by your health care provider. This is especially important if you are taking  prescription pain medicines. °· Do not engage in sexual activity until your health care provider says that you can do this. °General instructions °· Do not take baths, swim, or use a hot tub until your health care provider approves. °· Drink enough fluid to keep your urine clear or pale yellow. °· Urinate as soon as you feel the need to. Do not try to hold your urine for long periods of time. °· If your health care provider approves, you may take a stool softener for 2-3 weeks to prevent you from straining to have a bowel movement. °· Wear compression stockings as told by your health care provider. These stockings help to prevent blood clots and reduce swelling in your legs. °· Keep all follow-up visits as told by your health care provider. This is important. °Contact a health care provider if: °· You have difficulty urinating. °· You have a fever. °· You have pain that gets worse or does not improve with medicine. °· You have blood in your urine that does not go away after 1 week of resting and drinking more fluids. °· You have swelling in your penis or testicles. °Get help right away if: °· You are unable to urinate. °· You are having more blood clots in your urine instead of fewer. °· You have: °? Large blood clots. °? A lot of blood in your urine. °? Pain in your back or lower abdomen. °? Pain or swelling in your legs. °? Chills and you are shaking. °This information is not intended to   replace advice given to you by your health care provider. Make sure you discuss any questions you have with your health care provider. °Document Released: 03/24/2005 Document Revised: 11/25/2015 Document Reviewed: 12/14/2014 °Elsevier Interactive Patient Education © 2017 [employer] ° °

## 2018-01-28 NOTE — Progress Notes (Signed)
I have assumed care of this patient. I agree with the previous nurse's assessment. Will continue to monitor.

## 2018-01-28 NOTE — Op Note (Signed)
Preoperative diagnosis: 1. Bladder outlet obstruction secondary to BPH  Postoperative diagnosis:  1. Bladder outlet obstruction secondary to BPH  Procedure:  1. Cystoscopy 2. Transurethral Resection of the prostate  Surgeon: Bjorn Pippin. M.D.  Anesthesia: general  Complications: None  EBL: 50ml  Specimens: 1. TUR chips  Disposition of specimens: Pathology  Indication: Tony Meyers is a patient with bladder outlet obstruction secondary to benign prostatic hyperplasia. After reviewing the management options for treatment, he elected to proceed with the above surgical procedure(s). We have discussed the potential benefits and risks of the procedure, side effects of the proposed treatment, the likelihood of the patient achieving the goals of the procedure, and any potential problems that might occur during the procedure or recuperation. Informed consent has been obtained.  Description of procedure:  The patient was taken to the operating room and general  anesthesia was induced.  The patient was placed in the dorsal lithotomy position, prepped and draped in the usual sterile fashion, and preoperative antibiotics were administered. A preoperative time-out was performed.   Cystourethroscopy was performed.  The patient's urethra was examined and demonstrated bilobar prostatic hypertrophy with a small middle lobe. The bladder was then systematically examined in its entirety. There was mild trabeculation with no evidence of any bladder tumors, stones, or other mucosal pathology.  The ureteral orifices were identified and marked so as to be avoided during the procedure.  The prostate adenoma was then resected utilizing loop cautery resection with the bipolar cutting loop.  The prostate adenoma from the bladder neck back to the verumontanum was resected beginning at the six o'clock position and then extended to include the right and left lobes of the prostate and anterior prostate. Care was  taken not to resect distal to the verumontanum.  At the completion of the procedure the bladder was evacuated free of chips and hemostasis was insured.  Final inspection revealed intact ureteral orifices, a widely patent TUR channel and an intact external sphincter.   Hemostasis was then achieved with the cautery and the bladder was emptied and reinspected with no significant bleeding noted at the end of the procedure.    A 13fr 3 way catheter was then placed into the bladder and placed on continuous bladder irrigation.  The patient appeared to tolerate the procedure well and without complications.  The patient was able to be awakened and transferred to the recovery unit in satisfactory condition.

## 2018-01-28 NOTE — Interval H&P Note (Signed)
History and Physical Interval Note:  He doesn't need the  Prostate biopsy.  Repeat PSA is 2.91 which is below the level prior to his negative biopsy in 2017.   01/28/2018 9:02 AM  Tony Meyers  has presented today for surgery, with the diagnosis of BENIGN PROSTATE HYPERPLASIA WITH BLADDER OUTLET OBSTRUCTION, ELEVATED PSA  The various methods of treatment have been discussed with the patient and family. After consideration of risks, benefits and other options for treatment, the patient has consented to  Procedure(s): TRANSURETHRAL RESECTION OF THE PROSTATE (TURP) (N/A) POSSIBLE BIOPSY TRANSRECTAL ULTRASONIC PROSTATE (TUBP) (N/A) as a surgical intervention .  The patient's history has been reviewed, patient examined, no change in status, stable for surgery.  I have reviewed the patient's chart and labs.  Questions were answered to the patient's satisfaction.     Bjorn Pippin

## 2018-01-29 ENCOUNTER — Encounter (HOSPITAL_COMMUNITY): Payer: Self-pay | Admitting: Urology

## 2018-01-29 DIAGNOSIS — N401 Enlarged prostate with lower urinary tract symptoms: Secondary | ICD-10-CM | POA: Diagnosis not present

## 2018-01-29 LAB — HIV ANTIBODY (ROUTINE TESTING W REFLEX): HIV Screen 4th Generation wRfx: NONREACTIVE

## 2018-01-29 NOTE — Progress Notes (Signed)
Patient voided at about 0730, voided 60ml clear, red urine. Post void residual was .  Patient voided again at about 0810, voided of clear, red urine (was lighter than before). Post void residual was 50ml.   Sawyer Kahan, Joyce Copa

## 2018-01-29 NOTE — Discharge Summary (Signed)
Physician Discharge Summary  Patient ID: Tony Meyers MRN: [id number] DOB/AGE: Dec 04, 1955 62 y.o.  Admit date: 01/28/2018 Discharge date: 01/29/2018  Admission Diagnoses:  BPH with urinary obstruction  Discharge Diagnoses:  Principal Problem:   BPH with urinary obstruction   Past Medical History:  Diagnosis Date  . BPH (benign prostatic hyperplasia)   . Pulmonary emboli (HCC)     Surgeries: Procedure(s): TRANSURETHRAL RESECTION OF THE PROSTATE (TURP) on 01/28/2018   Consultants (if any):   Discharged Condition: Improved  Hospital Course: Tony Meyers is an 62 y.o. male who was admitted 01/28/2018 with a diagnosis of BPH with urinary obstruction and went to the operating room on 01/28/2018 and underwent the above named procedures.  His foley was removed along with his IV on 10/25 and he was discharged home when voiding.   He was given perioperative antibiotics:  Anti-infectives (From admission, onward)   Start     Dose/Rate Route Frequency Ordered Stop   01/28/18 1600  cephALEXin (KEFLEX) capsule 500 mg     500 mg Oral 3 times daily 01/28/18 1215     01/28/18 0837  ceFAZolin (ANCEF) 2-4 GM/100ML-% IVPB    Note to Pharmacy:  Augustine Radar   : cabinet override      01/28/18 0837 01/28/18 1050   01/28/18 0827  ceFAZolin (ANCEF) IVPB 2g/100 mL premix     2 g 200 mL/hr over 30 Minutes Intravenous 30 min pre-op 01/28/18 0827 01/28/18 1050    .  He was given sequential compression devices for DVT prophylaxis.  He benefited maximally from the hospital stay and there were no complications.    Recent vital signs:  Vitals:   01/28/18 2240 01/29/18 0557  BP: 112/72 121/70  Pulse: 80 69  Resp: 16 20  Temp: 98.2 F (36.8 C) 98.1 F (36.7 C)  SpO2: 94% 97%    Recent laboratory studies:  Lab Results  Component Value Date   HGB 14.1 01/22/2018   HGB 11.9 (L) 12/09/2017   HGB 12.2 (L) 11/24/2017   Lab Results  Component Value Date   WBC 5.7 01/22/2018   PLT 214  01/22/2018   Lab Results  Component Value Date   INR 0.98 01/28/2018   Lab Results  Component Value Date   NA 140 12/09/2017   K 3.8 12/09/2017   CL 103 12/09/2017   CO2 25 11/24/2017   BUN 9 12/09/2017   CREATININE 0.80 12/09/2017   GLUCOSE 98 12/09/2017    Discharge Medications:   Allergies as of 01/29/2018   No Known Allergies     Medication List    TAKE these medications   warfarin 3 MG tablet Commonly known as:  COUMADIN Take 3-4.5 mg by mouth daily. 3 MG on Sunday, Monday, Tuesday, Wednesday, Thursday, Friday and 4.5 MG on Saturday       Diagnostic Studies: No results found.  Disposition: Discharge disposition: 01-Home or Self Care       Discharge Instructions    Discontinue IV   Complete by:  As directed       Follow-up Information    Bjorn Pippin, MD On 02/10/2018.   Specialty:  Urology Why:  [address] information: [address] [place] [address] [phone]            Signed: Bjorn Pippin 01/29/2018, 6:50 AM

## 2018-01-29 NOTE — Progress Notes (Addendum)
Patient voided pink, clear urine at 0920, post voided residual 

## 2018-01-29 NOTE — Anesthesia Postprocedure Evaluation (Signed)
Anesthesia Post Note  Patient: Laquentin Loudermilk  Procedure(s) Performed: TRANSURETHRAL RESECTION OF THE PROSTATE (TURP) (N/A Prostate)     Patient location during evaluation: PACU Anesthesia Type: General Level of consciousness: awake and alert Pain management: pain level controlled Vital Signs Assessment: post-procedure vital signs reviewed and stable Respiratory status: spontaneous breathing, nonlabored ventilation, respiratory function stable and patient connected to nasal cannula oxygen Cardiovascular status: blood pressure returned to baseline and stable Postop Assessment: no apparent nausea or vomiting Anesthetic complications: no    Last Vitals:  Vitals:   01/28/18 2240 01/29/18 0557  BP: 112/72 121/70  Pulse: 80 69  Resp: 16 20  Temp: 36.8 C 36.7 C  SpO2: 94% 97%    Last Pain:  Vitals:   01/29/18 0800  TempSrc:   PainSc: 0-No pain                 Kennieth Rad

## 2018-04-09 NOTE — Progress Notes (Signed)
[facility] CONSULT NOTE  Patient Care Team: Sigmund HazelMiller, Lisa, MD as PCP - General (Family Medicine)  CHIEF COMPLAINTS/PURPOSE OF CONSULTATION: History of PE and DVT  HISTORY OF PRESENTING ILLNESS:  Tony Meyers 63 y.o. male is here because of a history of DVT and PE to evaluate if anticoagulation treatment is necessary. He was referred by Dr. Modesto CharonWong. He presents to the clinic today alone. 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 He was misdiagnosed and 3 months later he had swelling in his left leg, and had clots in his leg and lungs. He has been on Coumadin ever since and presents to the clinic today to evaluate if anticoagulation therapy needs to be continued. He denies any blood clot issues in the past 10 years. He denies a family history of blood clots. He denies smoking or taking testosterone or an IVC filter.   I reviewed his records extensively and collaborated the history with the patient.  REVIEW OF SYSTEMS:   Constitutional: Denies fevers, chills or abnormal night sweats Eyes: Denies blurriness of vision, double vision or watery eyes Ears, nose, mouth, throat, and face: Denies mucositis or sore throat Respiratory: Denies cough, dyspnea or wheezes Cardiovascular: Denies palpitation, chest discomfort or lower extremity swelling Gastrointestinal:  Denies nausea, heartburn or change in bowel habits Skin: Denies abnormal skin rashes Lymphatics: Denies new lymphadenopathy or easy bruising Neurological:Denies numbness, tingling or new weaknesses Behavioral/Psych: Mood is stable, no new changes  All other systems were reviewed with the patient and are negative.  MEDICAL HISTORY:  Past Medical History:  Diagnosis Date  . BPH (benign prostatic hyperplasia)   . Pulmonary emboli (HCC)     SURGICAL HISTORY: Past Surgical History:  Procedure Laterality Date  .  JOINT REPLACEMENT  1970s   ligament repair   . TONSILLECTOMY    . TRANSURETHRAL RESECTION OF PROSTATE N/A 01/28/2018   Procedure: TRANSURETHRAL RESECTION OF THE PROSTATE (TURP);  Surgeon: Bjorn PippinWrenn, John, MD;  Location: [facility];  Service: Urology;  Laterality: N/A;  . VASECTOMY      SOCIAL HISTORY: Social History   Socioeconomic History  . Marital status: Single    Spouse name: Not on file  . Number of children: Not on file  . Years of education: Not on file  . Highest education level: Not on file  Occupational History  . Not on file  Social Needs  . Financial resource strain: Not on file  . Food insecurity:    Worry: Not on file    Inability: Not on file  . Transportation needs:    Medical: Not on file    Non-medical: Not on file  Tobacco Use  . Smoking status: Never Smoker  . Smokeless tobacco: Never Used  Substance and Sexual Activity  . Alcohol use: Not Currently  . Drug use: Not Currently  . Sexual activity: Not on file  Lifestyle  . Physical activity:    Days per week: Not on file    Minutes per session: Not on file  . Stress: Not on file  Relationships  . Social connections:    Talks on phone: Not on file    Gets together: Not on file    Attends religious service: Not on file    Active member of club or organization: Not on file    Attends meetings of clubs or  organizations: Not on file    Relationship status: Not on file  . Intimate partner violence:    Fear of current or ex partner: Not on file    Emotionally abused: Not on file    Physically abused: Not on file    Forced sexual activity: Not on file  Other Topics Concern  . Not on file  Social History Narrative  . Not on file    FAMILY HISTORY: No family history on file.  ALLERGIES:  has No Known Allergies.  MEDICATIONS:  Current Outpatient Medications  Medication Sig Dispense Refill  . warfarin (COUMADIN) 3 MG tablet Take 3-4.5 mg by mouth daily. 3 MG on Sunday, Monday, Tuesday, Wednesday,  Thursday, Friday and 4.5 MG on Saturday     No current facility-administered medications for this visit.    PHYSICAL EXAMINATION: ECOG PERFORMANCE STATUS: 0 - Asymptomatic  Vitals:   04/13/18 1245  BP: 120/88  Pulse: 67  Resp: 18  Temp: 97.9 F (36.6 C)  SpO2: 99%   Filed Weights   04/13/18 1245  Weight: 225 lb 12.8 oz (102.4 kg)    GENERAL:alert, no distress and comfortable SKIN: skin color, texture, turgor are normal, no rashes or significant lesions EYES: normal, conjunctiva are pink and non-injected, sclera clear OROPHARYNX:no exudate, no erythema and lips, buccal mucosa, and tongue normal  NECK: supple, thyroid normal size, non-tender, without nodularity LYMPH:  no palpable lymphadenopathy in the cervical, axillary or inguinal LUNGS: clear to auscultation and percussion with normal breathing effort HEART: regular rate & rhythm and no murmurs and no lower extremity edema ABDOMEN:abdomen soft, non-tender and normal bowel sounds Musculoskeletal:no cyanosis of digits and no clubbing  PSYCH: alert & oriented x 3 with fluent speech NEURO: no focal motor/sensory deficits  LABORATORY DATA:  I have reviewed the data as listed Lab Results  Component Value Date   WBC 5.7 01/22/2018   HGB 14.1 01/22/2018   HCT 42.6 01/22/2018   MCV 92.0 01/22/2018   PLT 214 01/22/2018   Lab Results  Component Value Date   NA 140 12/09/2017   K 3.8 12/09/2017   CL 103 12/09/2017   CO2 25 11/24/2017    RADIOGRAPHIC STUDIES: I have personally reviewed the radiological reports and agreed with the findings in the report.  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  Risk factors: I discussed the entire spectrum of risk factors for blood clots and he  does not have any of them.  Duration of anticoagulation: I discussed with the patient that if there is no clear-cut etiology for a first-time blood clot 1 year anticoagulation would have been adequate.  Apparently had extensive blood work done at that time.  Recommendation: I will check for lupus anticoagulant.  If it is positive then he will remain on anticoagulation for life.  If it is negative then we can discontinue it.  I will call him with the results of this test and make further plans.      All questions were answered. The patient knows to call the clinic with any problems, questions or concerns.   Serena CroissantGudena, Nobel Brar, MD 04/13/2018   Jenness CornerI, Molly Dorshimer, am acting as scribe for Serena CroissantVinay Torii Royse, MD.  I have reviewed the above documentation for accuracy and completeness, and I agree with  the above.

## 2018-04-13 ENCOUNTER — Inpatient Hospital Stay: Payer: BC Managed Care – PPO | Attending: Hematology and Oncology | Admitting: Hematology and Oncology

## 2018-04-13 ENCOUNTER — Inpatient Hospital Stay: Payer: BC Managed Care – PPO

## 2018-04-13 VITALS — BP 120/88 | HR 67 | Temp 97.9°F | Resp 18 | Wt 225.8 lb

## 2018-04-13 DIAGNOSIS — Z7901 Long term (current) use of anticoagulants: Secondary | ICD-10-CM | POA: Diagnosis not present

## 2018-04-13 DIAGNOSIS — I2782 Chronic pulmonary embolism: Secondary | ICD-10-CM

## 2018-04-13 DIAGNOSIS — Z86718 Personal history of other venous thrombosis and embolism: Secondary | ICD-10-CM | POA: Insufficient documentation

## 2018-04-13 NOTE — Assessment & Plan Note (Signed)
Diagnosed with extensive left leg DVT and PE in 2009 in [place] Current treatment: Coumadin since 2009 Coumadin toxicities: None Patient is an avid runner and he usually runs [place].  Risk factors: I discussed the entire spectrum of risk factors for blood clots and he does not have any of them.  Duration of anticoagulation: I discussed with the patient that if there is no clear-cut etiology for a first-time blood clot 1 year anticoagulation would have been adequate.  Apparently had extensive blood work done at that time.  Recommendation: I will check for lupus anticoagulant.  If it is positive then he will remain on anticoagulation for life.  If it is negative then we can discontinue it.  I will call him with the results of this test and make further plans.

## 2018-04-14 ENCOUNTER — Telehealth: Payer: Self-pay | Admitting: Hematology and Oncology

## 2018-04-14 NOTE — Telephone Encounter (Signed)
Per 1/7 no los 

## 2018-04-15 ENCOUNTER — Telehealth: Payer: Self-pay | Admitting: Hematology and Oncology

## 2018-04-15 LAB — DRVVT MIX: DRVVT MIX: 38.3 s (ref 0.0–47.0)

## 2018-04-15 LAB — LUPUS ANTICOAGULANT PANEL
DRVVT: 59.8 s — ABNORMAL HIGH (ref 0.0–47.0)
PTT Lupus Anticoagulant: 40.4 s (ref 0.0–51.9)

## 2018-04-15 NOTE — Telephone Encounter (Signed)
I left a voicemail for the patient that the lupus anticoagulant test was negative Therefore he can discontinue anticoagulation at this time.

## 2018-05-26 ENCOUNTER — Telehealth: Payer: Self-pay

## 2018-05-26 NOTE — Telephone Encounter (Signed)
Pt called to request his lab results from 04/13/2018. Pt states vm that he has not heard form Dr.Gudena. Pt left a different phone number that what was listed in chart. Updated chart with new number. Called pt and lvm to call back to discuss lab results.

## 2018-05-26 NOTE — Telephone Encounter (Signed)
Pt called to inquire about his last lab work result to see if anticoagulation needs to be continued. Pt currently on coumadin 3mg  po. Pt last visit and lab was back in January 2020. Pt stated that he never got a call from MD about results.  Per Dr.Gudena note 04/14/2018, pt was called and VM was left regarding results. Pt may stop anticoagulation treatment at this time. Told pt that a copy of his lab results will be mailed today, as well as faxed to his PCP office. Pt verbalized understanding and thankful for the call.

## 2018-09-17 ENCOUNTER — Other Ambulatory Visit (HOSPITAL_COMMUNITY): Payer: Self-pay | Admitting: Orthopedic Surgery

## 2018-11-18 ENCOUNTER — Encounter (HOSPITAL_BASED_OUTPATIENT_CLINIC_OR_DEPARTMENT_OTHER): Payer: Self-pay

## 2018-11-18 ENCOUNTER — Ambulatory Visit (HOSPITAL_BASED_OUTPATIENT_CLINIC_OR_DEPARTMENT_OTHER): Admit: 2018-11-18 | Payer: BC Managed Care – PPO | Admitting: Orthopedic Surgery

## 2018-11-18 SURGERY — CORRECTION, HAMMER TOE
Anesthesia: General | Laterality: Right

## 2019-07-12 ENCOUNTER — Other Ambulatory Visit: Payer: Self-pay | Admitting: Family Medicine

## 2019-07-12 ENCOUNTER — Other Ambulatory Visit (HOSPITAL_COMMUNITY): Payer: Self-pay | Admitting: Family Medicine

## 2019-07-12 DIAGNOSIS — Z86711 Personal history of pulmonary embolism: Secondary | ICD-10-CM

## 2019-07-12 DIAGNOSIS — R0602 Shortness of breath: Secondary | ICD-10-CM

## 2019-07-14 ENCOUNTER — Ambulatory Visit (HOSPITAL_COMMUNITY)
Admission: RE | Admit: 2019-07-14 | Discharge: 2019-07-14 | Disposition: A | Discharge status: Home / Self Care | Payer: BC Managed Care – PPO | Source: Ambulatory Visit | Attending: Family Medicine | Admitting: Family Medicine

## 2019-07-14 ENCOUNTER — Other Ambulatory Visit: Payer: Self-pay

## 2019-07-14 ENCOUNTER — Encounter (HOSPITAL_COMMUNITY): Payer: Self-pay

## 2019-07-14 DIAGNOSIS — R0602 Shortness of breath: Secondary | ICD-10-CM | POA: Insufficient documentation

## 2019-07-14 DIAGNOSIS — Z86711 Personal history of pulmonary embolism: Secondary | ICD-10-CM | POA: Diagnosis present

## 2019-07-14 LAB — POCT I-STAT CREATININE: Creatinine, Ser: 1 mg/dL (ref 0.61–1.24)

## 2019-07-14 MED ORDER — SODIUM CHLORIDE (PF) 0.9 % IJ SOLN
INTRAMUSCULAR | Status: AC
  Filled 2019-07-14: qty 50

## 2019-07-14 MED ORDER — IOHEXOL 350 MG/ML SOLN
100.0000 mL | Freq: Once | INTRAVENOUS | Status: AC | PRN
  Administered 2019-07-14: 100 mL via INTRAVENOUS

## 2021-04-29 IMAGING — CT CT ANGIO CHEST
2 of 6 series · 19 of 46 positions shown · IV contrast (omnipaque)
Comparison: Chest radiography 07/12/2019

CLINICAL DATA: History of pulmonary emboli.  Follow-up.

EXAM:
CT ANGIOGRAPHY CHEST WITH CONTRAST
TECHNIQUE: Multidetector CT imaging of the chest was performed using the
standard protocol during bolus administration of intravenous
contrast. Multiplanar CT image reconstructions and MIPs were
obtained to evaluate the vascular anatomy.
CONTRAST:  100mL OMNIPAQUE IOHEXOL 350 MG/ML SOLN

[Series 6: thins · axial · 0.85mm/px · z∈[-330,-32]mm · 17 of 327 slices shown]
[im 15/327  lung]
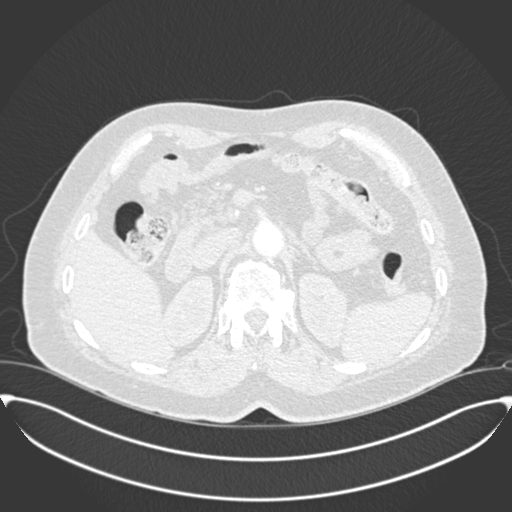
[im 29/327  soft-tissue]
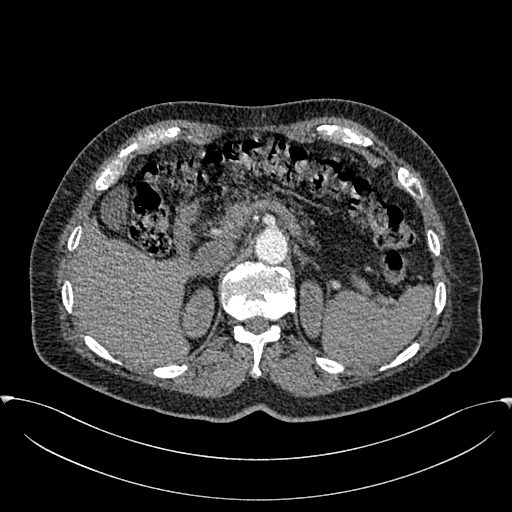
[im 57/327  lung]
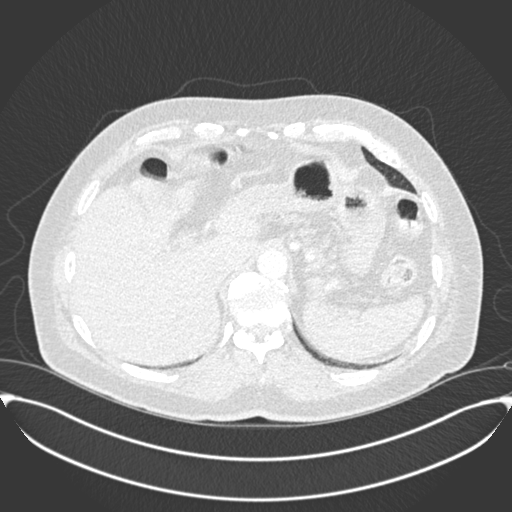
[im 71/327  soft-tissue]
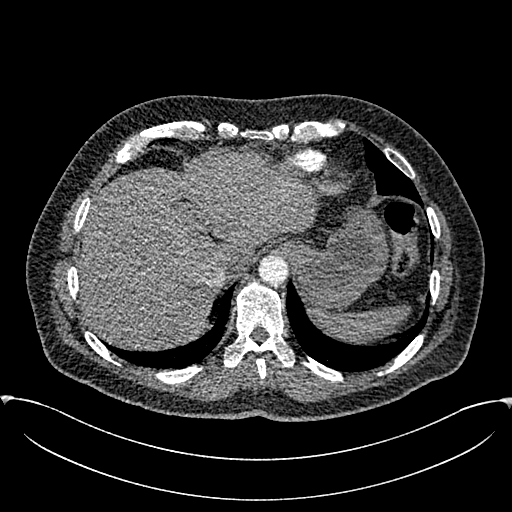
[im 86/327  lung]
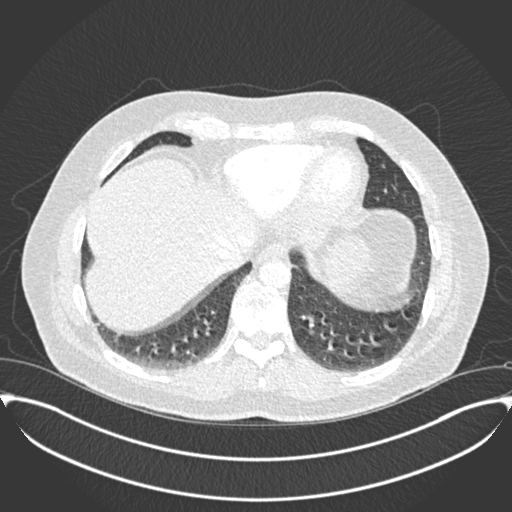
[im 114/327  soft-tissue]
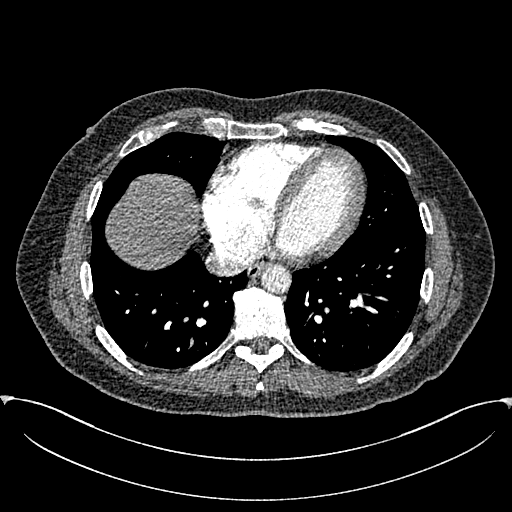
[im 128/327  lung]
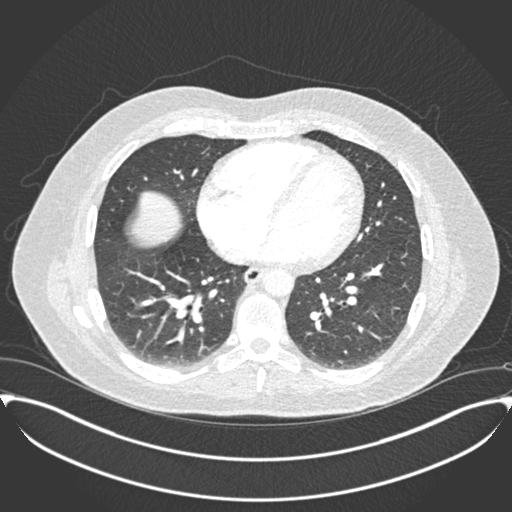
[im 142/327  soft-tissue]
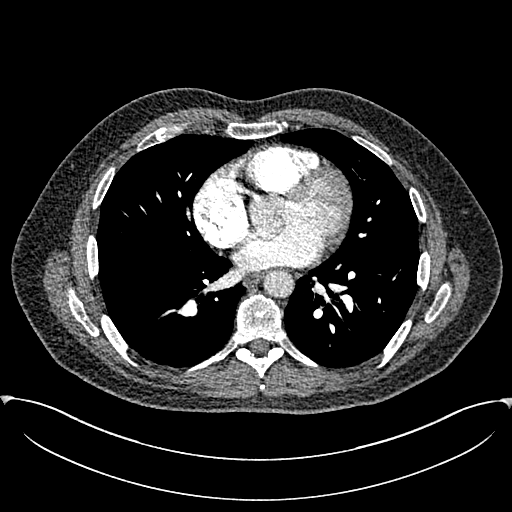
[im 171/327  lung]
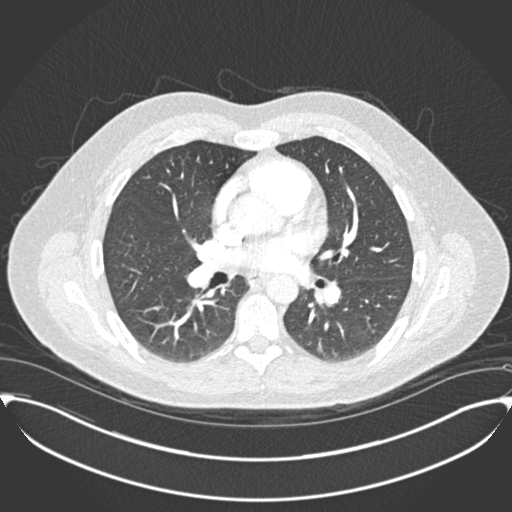
[im 185/327  soft-tissue]
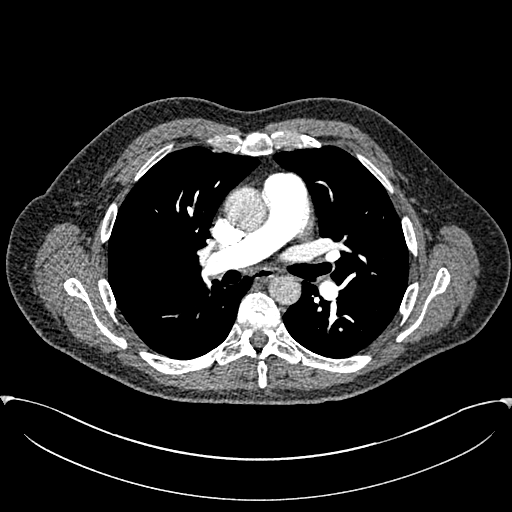
[im 199/327  lung]
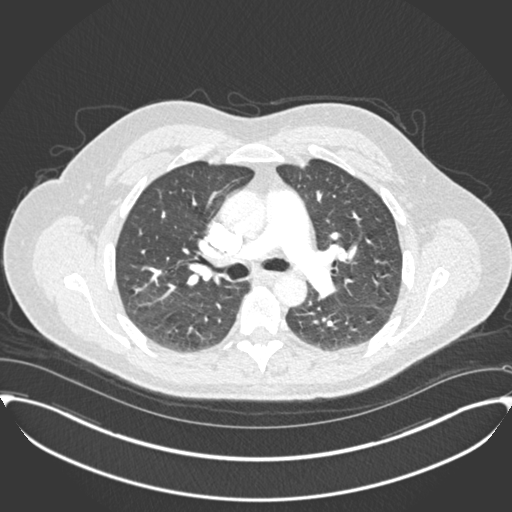
[im 213/327  soft-tissue]
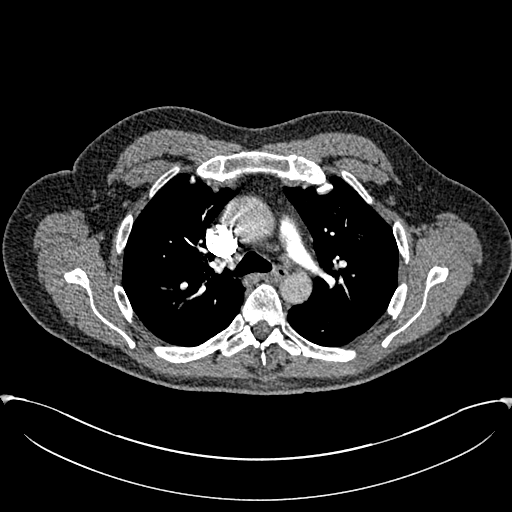
[im 241/327  lung]
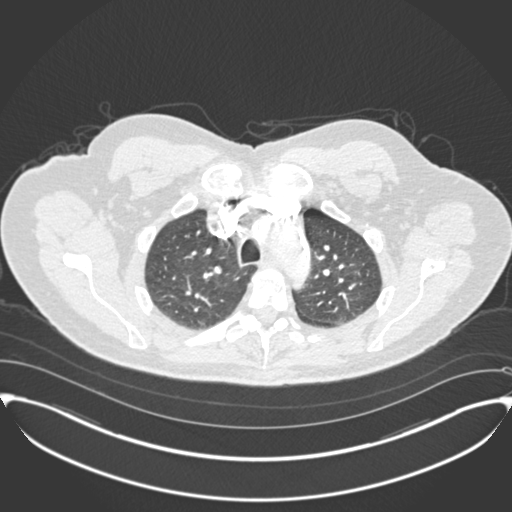
[im 256/327  soft-tissue]
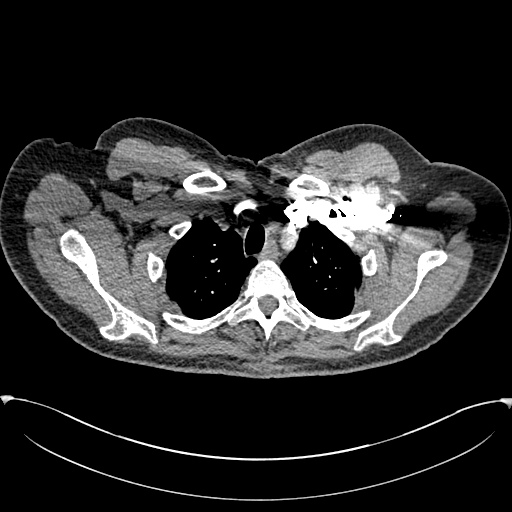
[im 270/327  lung]
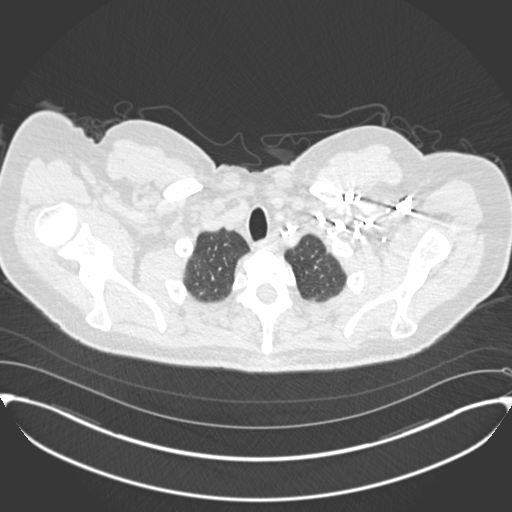
[im 298/327  soft-tissue]
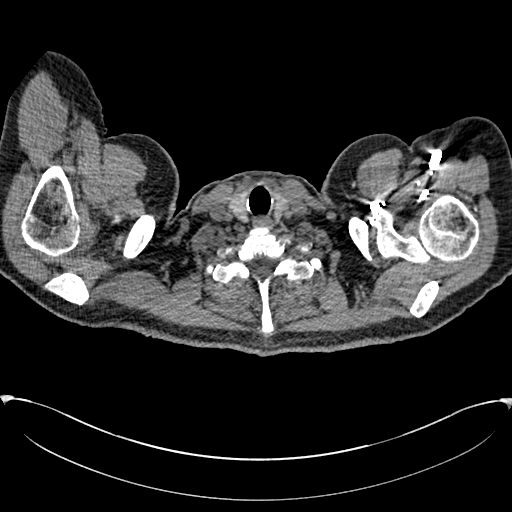
[im 312/327  lung]
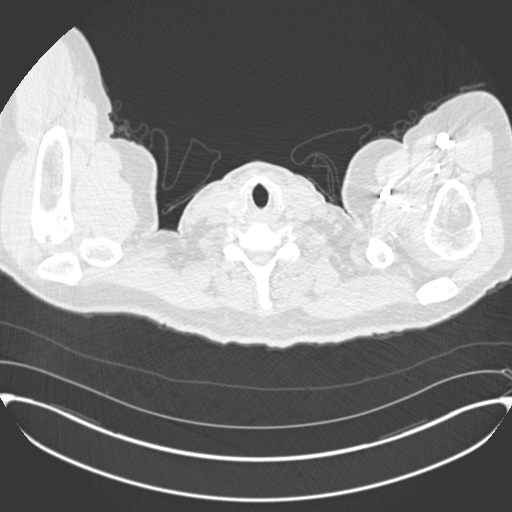

[Series 8: coronal mpr · coronal · 0.67mm/px · 2 of 95 slices shown]
[im 32/95  soft-tissue]
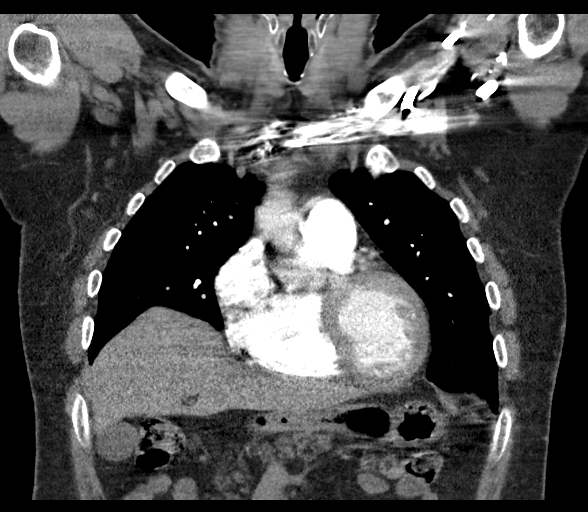
[im 63/95  soft-tissue]
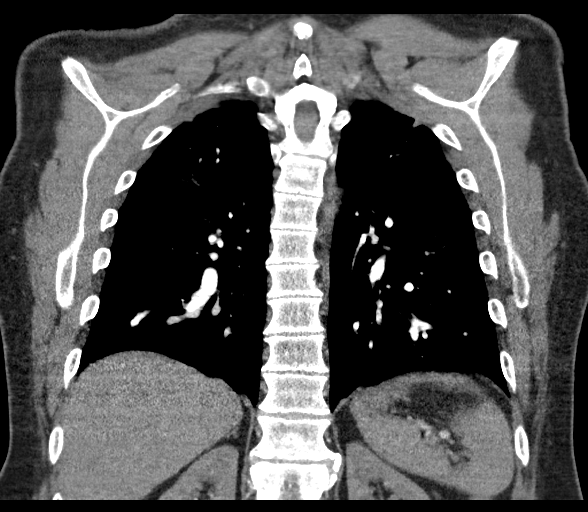

[19 of 46 positions shown; findings below may reference images not displayed]

FINDINGS: Cardiovascular: Pulmonary arterial opacification is good. There are
no pulmonary emboli. Heart size is normal. Minimal coronary artery
calcification. No aortic atherosclerotic calcification. No sign of
pulmonary artery hypertension.

Mediastinum/Nodes: No mass or lymphadenopathy.

Lungs/Pleura: Lungs are clear.  No pleural fluid.

Upper Abdomen: Negative

Musculoskeletal: Ordinary mild degenerative changes affect the
thoracic spine.

Review of the MIP images confirms the above findings.
IMPRESSION: No acute pulmonary emboli.  No findings of chronic pulmonary emboli.

No acute or significant finding.

These results will be called to the ordering clinician or
representative by the Radiologist Assistant, and communication
documented in the PACS or [REDACTED].
